# Patient Record
Sex: Male | Born: 1965 | Race: White | Hispanic: No | Smoking: Never smoker
Health system: Southern US, Community
[De-identification: ages and names within clinical notes are randomized; demographics above are authoritative.]

## PROBLEM LIST (undated history)

## (undated) DIAGNOSIS — F419 Anxiety disorder, unspecified: Secondary | ICD-10-CM

---

## 2015-09-18 ENCOUNTER — Emergency Department
Admission: EM | Admit: 2015-09-18 | Discharge: 2015-09-18 | Disposition: A | Discharge status: Home / Self Care | Payer: BLUE CROSS/BLUE SHIELD | Source: Home / Self Care | Attending: Family Medicine | Admitting: Family Medicine

## 2015-09-18 ENCOUNTER — Encounter: Payer: Self-pay | Admitting: *Deleted

## 2015-09-18 DIAGNOSIS — J069 Acute upper respiratory infection, unspecified: Secondary | ICD-10-CM | POA: Diagnosis not present

## 2015-09-18 DIAGNOSIS — R053 Chronic cough: Secondary | ICD-10-CM

## 2015-09-18 DIAGNOSIS — R05 Cough: Secondary | ICD-10-CM | POA: Diagnosis not present

## 2015-09-18 HISTORY — DX: Anxiety disorder, unspecified: F41.9

## 2015-09-18 MED ORDER — DOXYCYCLINE HYCLATE 100 MG PO CAPS: 100.0000 mg | ORAL_CAPSULE | Freq: Two times a day (BID) | ORAL | Status: DC

## 2015-09-18 MED ORDER — BENZONATATE 100 MG PO CAPS: 100.0000 mg | ORAL_CAPSULE | Freq: Three times a day (TID) | ORAL | Status: DC

## 2015-09-18 NOTE — ED Notes (Signed)
Pt c/o 1 month of dry cough, sinus problems, congestion, ear pressure. He started imropving for 2 weeks but has returned worse. Taken Sudafed, Aleve, Day/nyquil otc.

## 2015-09-18 NOTE — Discharge Instructions (Signed)
You may take 400-600mg Ibuprofen (Motrin) every 6-8 hours for fever and pain  °Alternate with Tylenol  °You may take 500mg Tylenol every 4-6 hours as needed for fever and pain  °Follow-up with your primary care provider next week for recheck of symptoms if not improving.  °Be sure to drink plenty of fluids and rest, at least 8hrs of sleep a night, preferably more while you are sick. °Return urgent care or go to closest ER if you cannot keep down fluids/signs of dehydration, fever not reducing with Tylenol, difficulty breathing/wheezing, stiff neck, worsening condition, or other concerns (see below)  °Please take antibiotics as prescribed and be sure to complete entire course even if you start to feel better to ensure infection does not come back. ° ° °Cool Mist Vaporizers °Vaporizers may help relieve the symptoms of a cough and cold. They add moisture to the air, which helps mucus to become thinner and less sticky. This makes it easier to breathe and cough up secretions. Cool mist vaporizers do not cause serious burns like hot mist vaporizers, which may also be called steamers or humidifiers. Vaporizers have not been proven to help with colds. You should not use a vaporizer if you are allergic to mold. °HOME CARE INSTRUCTIONS °· Follow the package instructions for the vaporizer. °· Do not use anything other than distilled water in the vaporizer. °· Do not run the vaporizer all of the time. This can cause mold or bacteria to grow in the vaporizer. °· Clean the vaporizer after each time it is used. °· Clean and dry the vaporizer well before storing it. °· Stop using the vaporizer if worsening respiratory symptoms develop. °  °This information is not intended to replace advice given to you by your health care provider. Make sure you discuss any questions you have with your health care provider. °  °Document Released: 05/05/2004 Document Revised: 08/13/2013 Document Reviewed: 12/26/2012 °Elsevier Interactive Patient  Education ©2016 [employer] ° °

## 2015-09-18 NOTE — ED Provider Notes (Signed)
CSN: [id number]     Arrival date & time 09/18/15  1556 History   First MD Initiated Contact with Patient 09/18/15 1628     Chief Complaint  Patient presents with  . Sinus Problem  . Cough   (Consider location/radiation/quality/duration/timing/severity/associated sxs/prior Treatment) HPI Pt is a 50yo male presenting to [facility] with c/o dry cough for 1 month, nasal congestion and ear pressure.  Pt states he started to improve for 2 weeks but then symptoms started to worsen again.  He has been taking Sudafed, mucinex, dayquil/nyquil and Aleve w/o relief.  Cough is becoming productive mild to moderate in severity, body aches and sore throat.  Denies fever, chills, n/v/d. His wife has been sick as well.  Denies recent travel. Denies chest pain or SOB. Denies hx of asthma.   Past Medical History  Diagnosis Date  . Anxiety    History reviewed. No pertinent past surgical history. History reviewed. No pertinent family history. Social History  Substance Use Topics  . Smoking status: Never Smoker   . Smokeless tobacco: Never Used  . Alcohol Use: No    Review of Systems  Constitutional: Negative for fever and chills.  HENT: Positive for congestion, ear pain, postnasal drip, rhinorrhea, sinus pressure, sore throat and voice change. Negative for trouble swallowing.   Respiratory: Positive for cough and wheezing. Negative for shortness of breath.   Cardiovascular: Negative for chest pain and palpitations.  Gastrointestinal: Negative for nausea, vomiting, abdominal pain and diarrhea.  Musculoskeletal: Negative for myalgias, back pain and arthralgias.  Skin: Negative for rash.  Neurological: Positive for headaches. Negative for dizziness and light-headedness.  All other systems reviewed and are negative.   Allergies  Review of patient's allergies indicates no known allergies.  Home Medications   Prior to Admission medications   Medication Sig Start Date End Date Taking? Authorizing Provider   citalopram (CELEXA) 20 MG tablet Take 20 mg by mouth daily.   Yes Historical Provider, MD  benzonatate (TESSALON) 100 MG capsule Take 1 capsule (100 mg total) by mouth every 8 (eight) hours. 09/18/15   Junius Finner, PA-C  doxycycline (VIBRAMYCIN) 100 MG capsule Take 1 capsule (100 mg total) by mouth 2 (two) times daily. One po bid x 7 days 09/18/15   Junius Finner, PA-C   Meds Ordered and Administered this Visit  Medications - No data to display  BP 146/99 mmHg  Pulse 70  Temp(Src) 98.4 F (36.9 C) (Oral)  Resp 16  Ht  (1.778 m)  Wt 210 lb (95.255 kg)  BMI 30.13 kg/m2  SpO2 99% No data found.   Physical Exam  Constitutional: He appears well-developed and well-nourished.  HENT:  Head: Normocephalic and atraumatic.  Right Ear: Hearing, tympanic membrane and external ear normal. A foreign body ( PE tube about to come out. no bleeding.) is present.  Left Ear: Hearing, tympanic membrane, external ear and ear canal normal.  Nose: Mucosal edema present. Right sinus exhibits no maxillary sinus tenderness and no frontal sinus tenderness. Left sinus exhibits no maxillary sinus tenderness and no frontal sinus tenderness.  Mouth/Throat: Uvula is midline and mucous membranes are normal. Posterior oropharyngeal erythema present. No oropharyngeal exudate, posterior oropharyngeal edema or tonsillar abscesses.  Eyes: Conjunctivae are normal. No scleral icterus.  Neck: Normal range of motion. Neck supple.  Cardiovascular: Normal rate, regular rhythm and normal heart sounds.   Pulmonary/Chest: Effort normal and breath sounds normal. No respiratory distress. He has no wheezes. He has no rales. He exhibits no  tenderness.  Abdominal: Soft. He exhibits no distension and no mass. There is no tenderness. There is no rebound and no guarding.  Musculoskeletal: Normal range of motion.  Neurological: He is alert.  Skin: Skin is warm and dry.  Nursing note and vitals reviewed.   ED Course  Procedures  (including critical care time)  Labs Review Labs Reviewed - No data to display  Imaging Review No results found.    MDM   1. Acute upper respiratory infection   2. Persistent cough for 3 weeks or longer    Pt c/o URI symptoms for about 1 month that are now worsening. Pt appears well, O2 Sat 99% on RA.  Due to duration of symptoms, will place on antibiotic. Rx: doxycycline and tessalon   Advised pt to use acetaminophen and ibuprofen as needed for fever and pain. Encouraged rest and fluids. F/u with PCP in 7-10 days if not improving, sooner if worsening. Pt verbalized understanding and agreement with tx plan.     Junius Finner, PA-C 09/18/15 1724

## 2016-04-07 ENCOUNTER — Encounter (HOSPITAL_BASED_OUTPATIENT_CLINIC_OR_DEPARTMENT_OTHER): Payer: Self-pay | Admitting: *Deleted

## 2016-04-07 ENCOUNTER — Emergency Department (HOSPITAL_BASED_OUTPATIENT_CLINIC_OR_DEPARTMENT_OTHER)
Admission: EM | Admit: 2016-04-07 | Discharge: 2016-04-07 | Disposition: A | Discharge status: Home / Self Care | Payer: BLUE CROSS/BLUE SHIELD | Attending: Emergency Medicine | Admitting: Emergency Medicine

## 2016-04-07 ENCOUNTER — Emergency Department (HOSPITAL_BASED_OUTPATIENT_CLINIC_OR_DEPARTMENT_OTHER): Payer: BLUE CROSS/BLUE SHIELD

## 2016-04-07 DIAGNOSIS — I861 Scrotal varices: Secondary | ICD-10-CM | POA: Insufficient documentation

## 2016-04-07 DIAGNOSIS — R1032 Left lower quadrant pain: Secondary | ICD-10-CM | POA: Insufficient documentation

## 2016-04-07 DIAGNOSIS — R1030 Lower abdominal pain, unspecified: Secondary | ICD-10-CM

## 2016-04-07 LAB — URINALYSIS, ROUTINE W REFLEX MICROSCOPIC
BILIRUBIN URINE: NEGATIVE
GLUCOSE, UA: NEGATIVE mg/dL
HGB URINE DIPSTICK: NEGATIVE
KETONES UR: NEGATIVE mg/dL
LEUKOCYTES UA: NEGATIVE
Nitrite: NEGATIVE
PROTEIN: NEGATIVE mg/dL
Specific Gravity, Urine: 1.003 — ABNORMAL LOW (ref 1.005–1.030)
pH: 6.5 (ref 5.0–8.0)

## 2016-04-07 NOTE — ED Triage Notes (Signed)
Intermittent left groin pain with radiation into testicle x 1 month.  Denies swelling.  Dull pain with increased pain on palpation.

## 2016-04-07 NOTE — ED Provider Notes (Signed)
[facility]   CSN: [id number] Arrival date & time: 04/07/16  1218     History   Chief Complaint Chief Complaint  Patient presents with  . Groin Pain    HPI Gregory Rowland is a 50 y.o. male.  50 year old male with history of anxiety who presents with left groin pain. The patient reports a one-month history of intermittent left groin pain radiating into his left testicle. He states the pain is minimal to absent when he is laying down but worsens when he is standing up and walking around at work. He has not noticed any masses or swelling in his groin or scrotum. No urinary symptoms, fever, vomiting, abdominal pain, or recent illness. No skin changes. No recent trauma.   The history is provided by the patient.  Groin Pain     Past Medical History:  Diagnosis Date  . Anxiety     There are no active problems to display for this patient.   History reviewed. No pertinent surgical history.     Home Medications    Prior to Admission medications   Medication Sig Start Date End Date Taking? Authorizing Provider  citalopram (CELEXA) 20 MG tablet Take 20 mg by mouth daily.    Historical Provider, MD    Family History History reviewed. No pertinent family history.  Social History Social History  Substance Use Topics  . Smoking status: Never Smoker  . Smokeless tobacco: Never Used  . Alcohol use No     Allergies   Review of patient's allergies indicates no known allergies.   Review of Systems Review of Systems 10 Systems reviewed and are negative for acute change except as noted in the HPI.   Physical Exam Updated Vital Signs BP 151/95 (BP Location: Right Arm)   Pulse 64   Temp 98 F (36.7 C) (Oral)   Resp 18   Ht 5\' 10"  (1.778 m)   Wt 205 lb (93 kg)   SpO2 99%   BMI 29.41 kg/m   Physical Exam  Constitutional: He is oriented to person, place, and time. He appears well-developed and well-nourished. No distress.  HENT:  Head:  Normocephalic and atraumatic.  Eyes: Conjunctivae are normal.  Neck: Neck supple.  Cardiovascular: Normal rate, regular rhythm and normal heart sounds.   No murmur heard. Pulmonary/Chest: Effort normal and breath sounds normal.  Abdominal: Soft. Bowel sounds are normal. He exhibits no distension. There is no tenderness.  Genitourinary: Penis normal.  Genitourinary Comments: B/l descended testes w/ no focal tenderness, swelling, scrotal enlargement, or erythema  Musculoskeletal: He exhibits no edema.  Neurological: He is alert and oriented to person, place, and time.  Fluent speech  Skin: Skin is warm and dry. No rash noted. No erythema.  Psychiatric: He has a normal mood and affect. Judgment normal.  Nursing note and vitals reviewed. Chaperone was present during exam.    ED Treatments / Results  Labs (all labs ordered are listed, but only abnormal results are displayed) Labs Reviewed  URINALYSIS, ROUTINE W REFLEX MICROSCOPIC (NOT AT [facility]) - Abnormal; Notable for the following:       Result Value   Specific Gravity, Urine 1.003 (*)    All other components within normal limits    EKG  EKG Interpretation None       Radiology [place] Scrotum  Result Date: 04/07/2016 CLINICAL DATA:  Left groin pain that radiates to the testicle. Symptoms for 1 month. EXAM: SCROTAL ULTRASOUND DOPPLER ULTRASOUND OF THE TESTICLES TECHNIQUE:  Complete ultrasound examination of the testicles, epididymis, and other scrotal structures was performed. Color and spectral Doppler ultrasound were also utilized to evaluate blood flow to the testicles. COMPARISON:  None. FINDINGS: Right testicle Measurements: 4.6 x 2.3 x 3.1 cm. No mass or microlithiasis visualized. Left testicle Measurements: 3.3 x 2.2 x 2.3 cm. No microlithiasis. There are 2 adjacent hypoechoic structures in the testicle, largest measuring 0.3 cm. This could represent cystic structures but indeterminate. Right epididymis: Multiple cysts within the  epididymal head, largest measured up to 1.4 cm. Left epididymis:  Normal in size and appearance. Hydrocele:  None visualized. Varicocele:  Left varicocele. Pulsed Doppler interrogation of both testes demonstrates normal low resistance arterial and venous waveforms bilaterally. IMPRESSION: Negative for testicular torsion. Left varicocele. Small hypoechoic structures in the left testicle could represent small cystic structures but indeterminate. Recommend a 3 month follow-up ultrasound to ensure stability. Electronically Signed   By: Richarda OverlieAdam  Henn M.D.   On: 04/07/2016 13:53   [place] Art/ven Flow Abd Pelv Doppler  Result Date: 04/07/2016 CLINICAL DATA:  Left groin pain that radiates to the testicle. Symptoms for 1 month. EXAM: SCROTAL ULTRASOUND DOPPLER ULTRASOUND OF THE TESTICLES TECHNIQUE: Complete ultrasound examination of the testicles, epididymis, and other scrotal structures was performed. Color and spectral Doppler ultrasound were also utilized to evaluate blood flow to the testicles. COMPARISON:  None. FINDINGS: Right testicle Measurements: 4.6 x 2.3 x 3.1 cm. No mass or microlithiasis visualized. Left testicle Measurements: 3.3 x 2.2 x 2.3 cm. No microlithiasis. There are 2 adjacent hypoechoic structures in the testicle, largest measuring 0.3 cm. This could represent cystic structures but indeterminate. Right epididymis: Multiple cysts within the epididymal head, largest measured up to 1.4 cm. Left epididymis:  Normal in size and appearance. Hydrocele:  None visualized. Varicocele:  Left varicocele. Pulsed Doppler interrogation of both testes demonstrates normal low resistance arterial and venous waveforms bilaterally. IMPRESSION: Negative for testicular torsion. Left varicocele. Small hypoechoic structures in the left testicle could represent small cystic structures but indeterminate. Recommend a 3 month follow-up ultrasound to ensure stability. Electronically Signed   By: Richarda OverlieAdam  Henn M.D.   On: 04/07/2016 13:53     Procedures Procedures (including critical care time)  Medications Ordered in ED Medications - No data to display   Initial Impression / Assessment and Plan / ED Course  I have reviewed the triage vital signs and the nursing notes.  Pertinent labs & imaging results that were available during my care of the patient were reviewed by me and considered in my medical decision making (see chart for details).  Clinical Course   Pt w/ 1 month of Intermittent left groin pain. He was well-appearing on exam, UA normal. No obvious testicular swelling or abnormalities on physical exam. Ultrasound shows left varicocele, possible cystic structures on left testicle. I discussed results and importance of follow-up with urologist for further evaluation and provided him with clinic information. Return precautions extensively reviewed. Patient voiced understanding was discharged in satisfactory condition.  Final Clinical Impressions(s) / ED Diagnoses   Final diagnoses:  Groin pain  Left varicocele    New Prescriptions New Prescriptions   No medications on file     Laurence Spatesachel Morgan Lyndal Alamillo, MD 04/07/16 1537

## 2018-04-02 IMAGING — US US SCROTUM
1 series · 13 of 25 positions shown · non-contrast
Comparison: None.

CLINICAL DATA: Left groin pain that radiates to the testicle.
Symptoms for 1 month.

EXAM:
SCROTAL ULTRASOUND
DOPPLER ULTRASOUND OF THE TESTICLES
TECHNIQUE: Complete ultrasound examination of the testicles, epididymis, and
other scrotal structures was performed. Color and spectral Doppler
ultrasound were also utilized to evaluate blood flow to the
testicles.

[Series 1: us scrotum · 0.07mm/px · 13 of 62 slices shown]
[im 1/62]
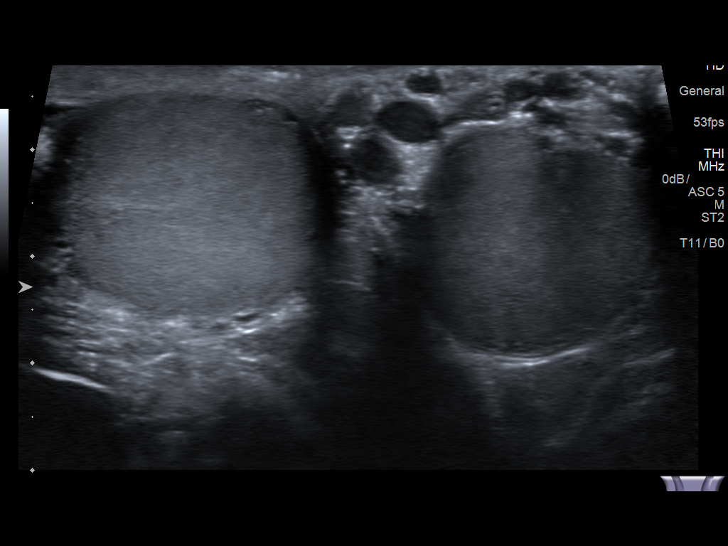
[im 6/62]
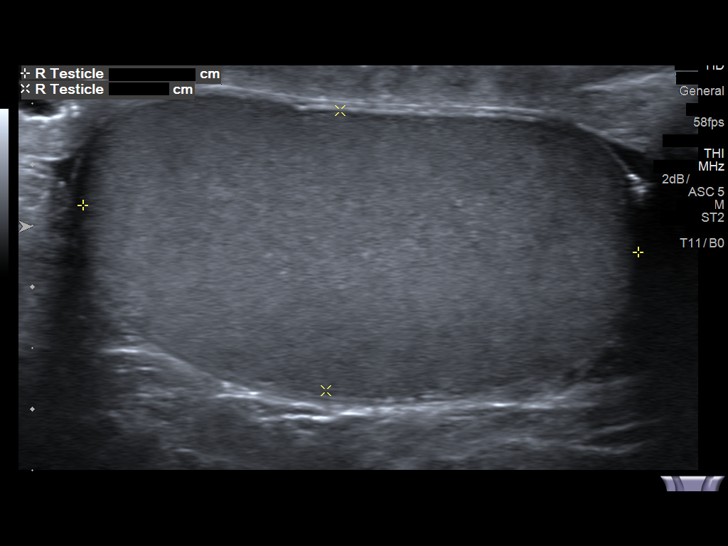
[im 11/62]
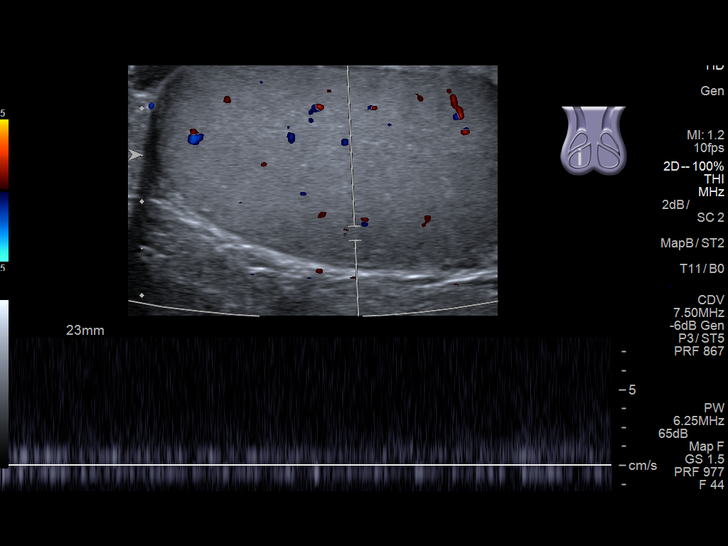
[im 16/62]
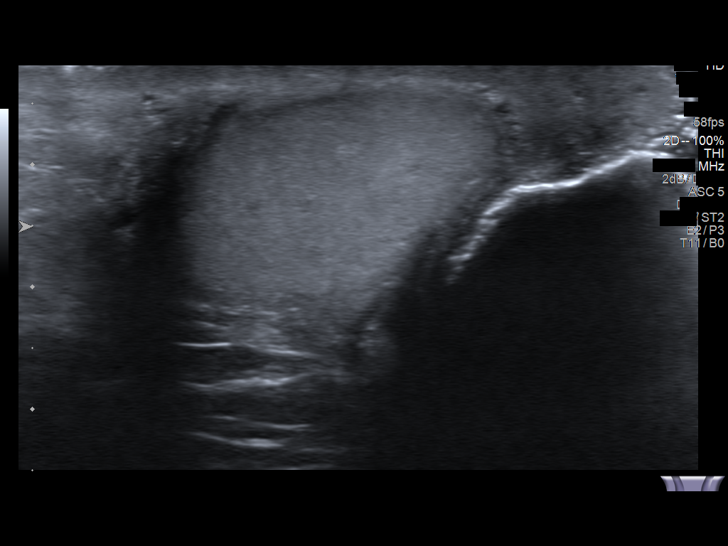
[im 21/62]
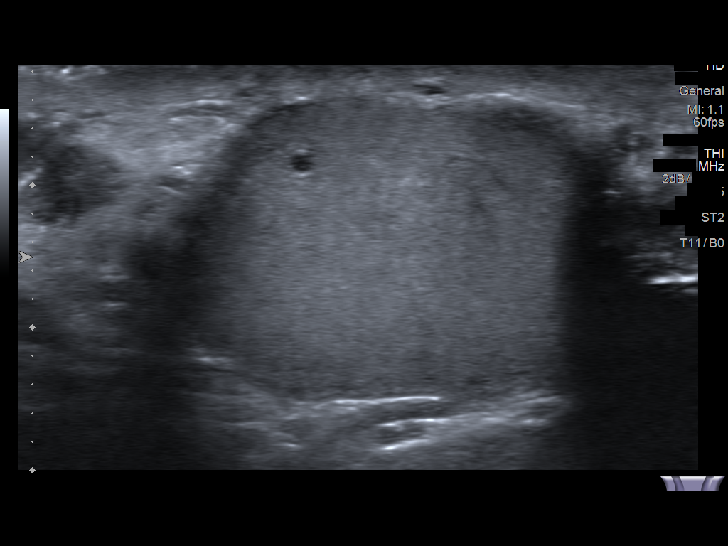
[im 26/62]
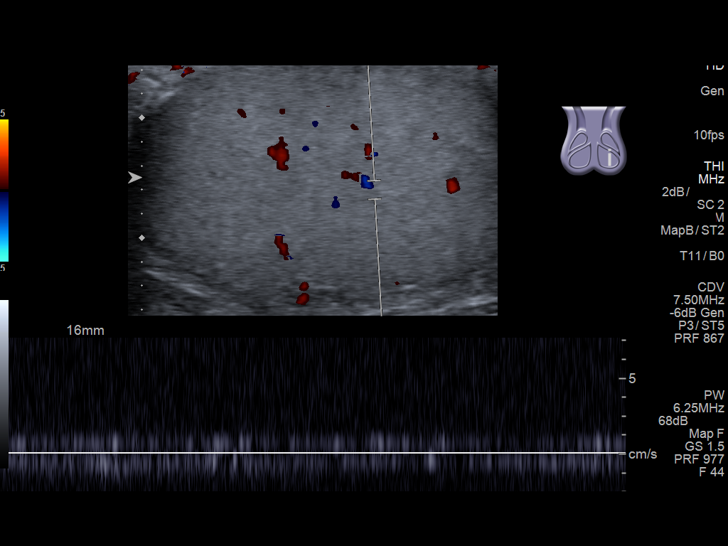
[im 31/62]
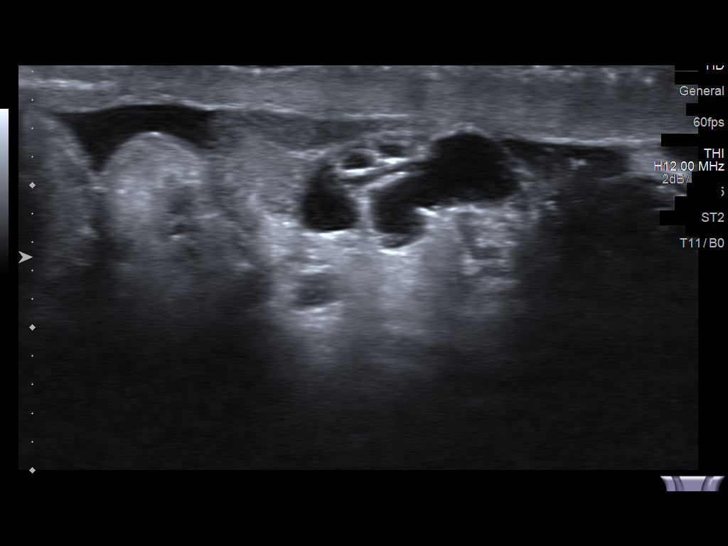
[im 36/62]
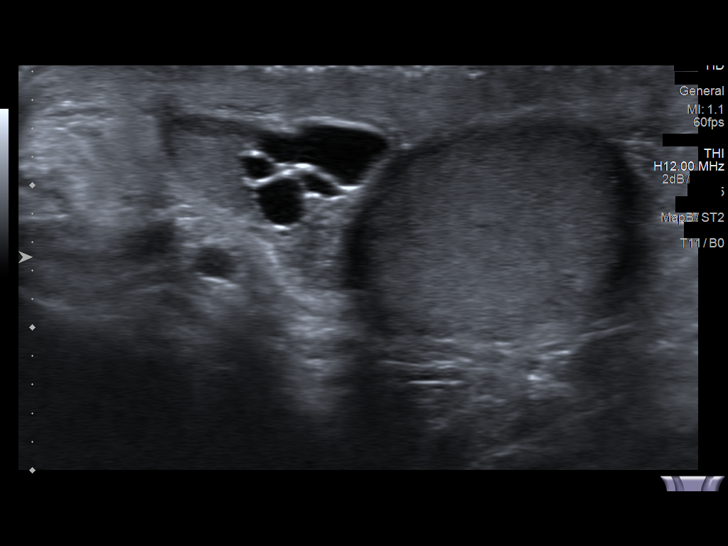
[im 41/62]
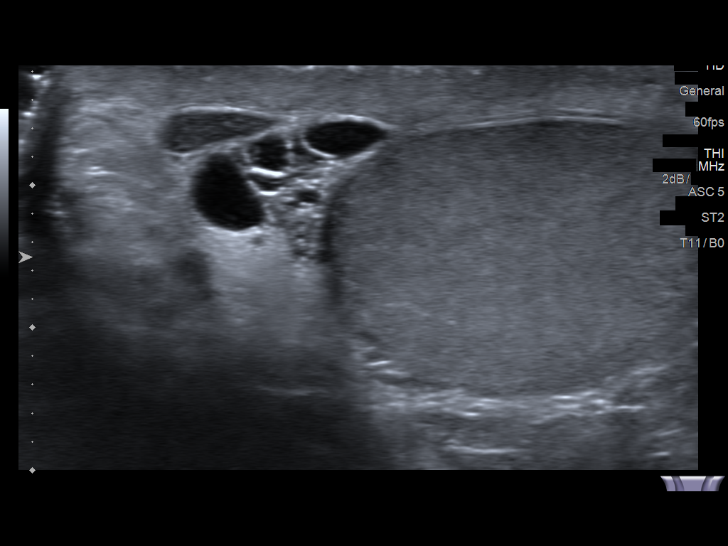
[im 46/62]
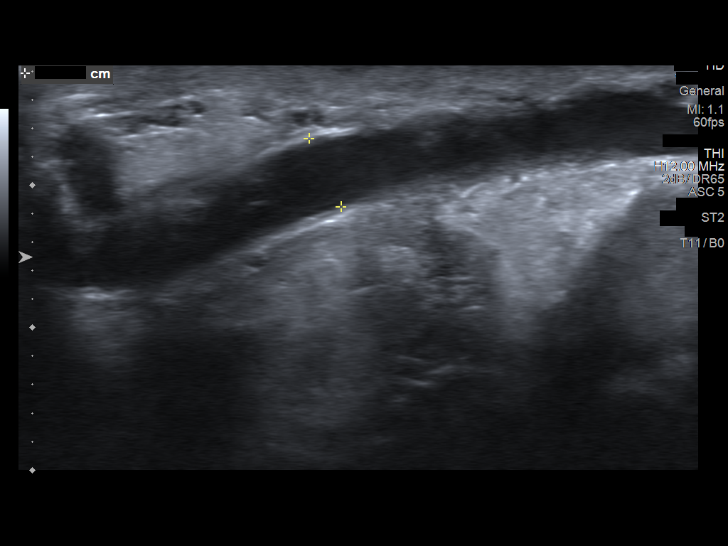
[im 51/62]
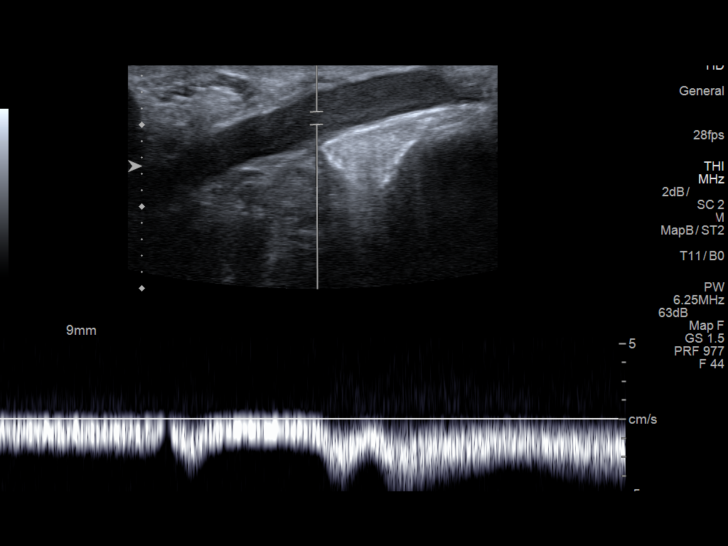
[im 56/62]
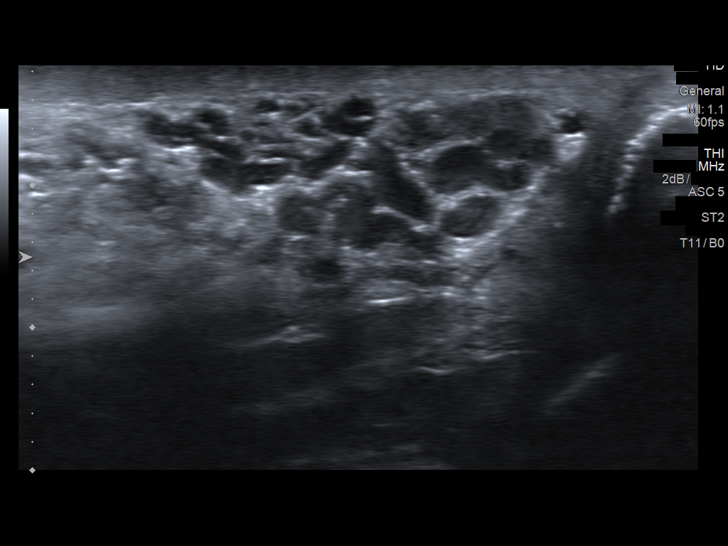
[im 62/62]
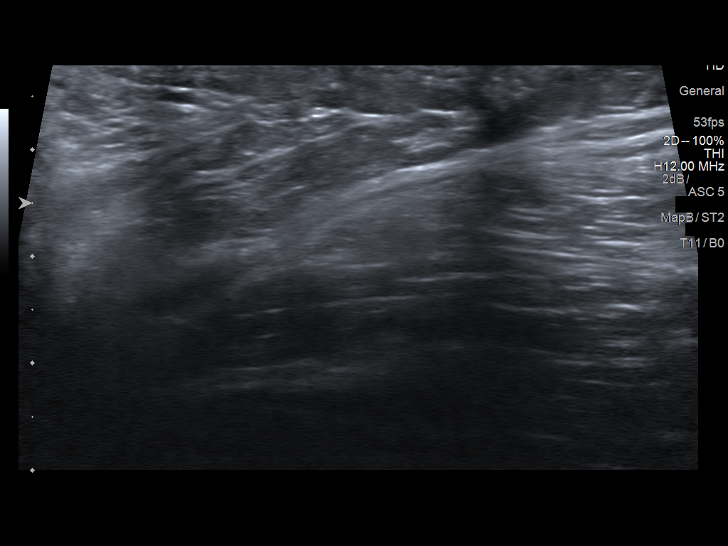

[13 of 25 positions shown; findings below may reference images not displayed]

FINDINGS: Right testicle

Measurements: 4.6 x 2.3 x 3.1 cm. No mass or microlithiasis
visualized.

Left testicle

Measurements: 3.3 x 2.2 x 2.3 cm. No microlithiasis. There are 2
adjacent hypoechoic structures in the testicle, largest measuring
0.3 cm. This could represent cystic structures but indeterminate.

Right epididymis: Multiple cysts within the epididymal head, largest
measured up to 1.4 cm.

Left epididymis:  Normal in size and appearance.

Hydrocele:  None visualized.

Varicocele:  Left varicocele.

Pulsed Doppler interrogation of both testes demonstrates normal low
resistance arterial and venous waveforms bilaterally.
IMPRESSION: Negative for testicular torsion.

Left varicocele.

Small hypoechoic structures in the left testicle could represent
small cystic structures but indeterminate. Recommend a 3 month
follow-up ultrasound to ensure stability.

## 2021-06-07 ENCOUNTER — Emergency Department (INDEPENDENT_AMBULATORY_CARE_PROVIDER_SITE_OTHER)
Admission: EM | Admit: 2021-06-07 | Discharge: 2021-06-07 | Disposition: A | Discharge status: Home / Self Care | Payer: BC Managed Care – PPO | Source: Home / Self Care

## 2021-06-07 ENCOUNTER — Other Ambulatory Visit: Payer: Self-pay

## 2021-06-07 DIAGNOSIS — J0100 Acute maxillary sinusitis, unspecified: Secondary | ICD-10-CM

## 2021-06-07 DIAGNOSIS — J309 Allergic rhinitis, unspecified: Secondary | ICD-10-CM | POA: Diagnosis not present

## 2021-06-07 DIAGNOSIS — R509 Fever, unspecified: Secondary | ICD-10-CM

## 2021-06-07 MED ORDER — AMOXICILLIN-POT CLAVULANATE 875-125 MG PO TABS: 1.0000 | ORAL_TABLET | Freq: Two times a day (BID) | ORAL | 0 refills | Status: AC

## 2021-06-07 MED ORDER — PREDNISONE 20 MG PO TABS: ORAL_TABLET | ORAL | 0 refills | Status: AC

## 2021-06-07 MED ORDER — FEXOFENADINE HCL 180 MG PO TABS: 180.0000 mg | ORAL_TABLET | Freq: Every day | ORAL | 0 refills | Status: AC

## 2021-06-07 NOTE — Discharge Instructions (Addendum)
Advised patient to take medication as directed with food to completion.  Advised patient to take Prednisone burst with first dose of antibiotic for 5 of 10 days.  Advised patient to take Allegra with first dose of antibiotic for 7 of 10 days.  Encouraged patient increase daily water intake while taking these medications.  Advised we will follow-up with COVID-19 flu/AB results once returned.

## 2021-06-07 NOTE — ED Triage Notes (Signed)
Pt c/o headache (dull)  x 2 months. Also c/o neck swelling on RT side. Pain sometime shoots down throat causing him to cough. Problem has been intermittent since having COVID last Dec. Chronically fatigued. Ibuprofen prn.

## 2021-06-07 NOTE — ED Provider Notes (Signed)
[facility]    CSN: [id number] Arrival date & time: 06/07/21  1233      History   Chief Complaint Chief Complaint  Patient presents with   Headache   Neck swelling (RT)    HPI Gregory Rowland is a 55 y.o. male.   HPI 55 year old male presents with dull headache for 2 months.  Patient reports neck swelling on right side for days.  Reports throat pain will sometimes shoot down neck causing cough.  Reports these problems have been intermittent since COVID last December.  Additionally, patient is complaining of chronic fatigue.  Past Medical History:  Diagnosis Date   Anxiety     There are no problems to display for this patient.   History reviewed. No pertinent surgical history.     Home Medications    Prior to Admission medications   Medication Sig Start Date End Date Taking? Authorizing Provider  amoxicillin-clavulanate (AUGMENTIN) 875-125 MG tablet Take 1 tablet by mouth 2 (two) times daily for 10 days. 06/07/21 06/17/21 Yes Trevor Iha, FNP  amoxicillin-clavulanate (AUGMENTIN) 875-125 MG tablet Take 1 tablet by mouth every 12 (twelve) hours. 06/07/21  Yes Trevor Iha, FNP  citalopram (CELEXA) 20 MG tablet Take 20 mg by mouth daily.   Yes [provider]  fexofenadine (ALLEGRA ALLERGY) 180 MG tablet Take 1 tablet (180 mg total) by mouth daily for 15 days. 06/07/21 06/22/21 Yes Trevor Iha, FNP  fexofenadine [facility] ALLERGY) 180 MG tablet Take 1 tablet (180 mg total) by mouth daily for 15 days. 06/07/21 06/22/21 Yes Trevor Iha, FNP  predniSONE (DELTASONE) 20 MG tablet Take 3 tabs p.o. x 5 days. 06/07/21  Yes Trevor Iha, FNP  predniSONE (DELTASONE) 20 MG tablet Take 3 tabs p.o. x 5 days. 06/07/21  Yes Trevor Iha, FNP    Family History History reviewed. No pertinent family history.  Social History Social History   Tobacco Use   Smoking status: Never   Smokeless tobacco: Never  Vaping Use   Vaping Use: Never used  Substance  Use Topics   Alcohol use: No   Drug use: No     Allergies   Patient has no known allergies.   Review of Systems Review of Systems  Respiratory:  Positive for cough.   Neurological:  Positive for headaches.  All other systems reviewed and are negative.   Physical Exam Triage Vital Signs ED Triage Vitals  Enc Vitals Group     BP 06/07/21 1326 (!) 169/95     Pulse Rate 06/07/21 1326 93     Resp 06/07/21 1326 18     Temp 06/07/21 1326 99.4 F (37.4 C)     Temp Source 06/07/21 1326 Oral     SpO2 06/07/21 1326 99 %     Weight --      Height --      Head Circumference --      Peak Flow --      Pain Score 06/07/21 1328 3     Pain Loc --      Pain Edu? --      Excl. in GC? --    No data found.  Updated Vital Signs BP (!) 169/95 (BP Location: Right Arm)   Pulse 93   Temp 99.4 F (37.4 C) (Oral)   Resp 18   SpO2 99%    Physical Exam Vitals and nursing note reviewed.  Constitutional:      Appearance: He is obese.  HENT:     Head:  Normocephalic and atraumatic.     Right Ear: Tympanic membrane, ear canal and external ear normal.     Left Ear: Tympanic membrane, ear canal and external ear normal.     Mouth/Throat:     Mouth: Mucous membranes are moist.     Pharynx: Oropharynx is clear.     Comments: Moderate amount of clear drainage of posterior oropharynx noted Eyes:     Extraocular Movements: Extraocular movements intact.     Conjunctiva/sclera: Conjunctivae normal.     Pupils: Pupils are equal, round, and reactive to light.  Cardiovascular:     Rate and Rhythm: Normal rate and regular rhythm.     Pulses: Normal pulses.     Heart sounds: Normal heart sounds. No murmur heard. Pulmonary:     Effort: Pulmonary effort is normal.     Breath sounds: Normal breath sounds. No wheezing, rhonchi or rales.  Musculoskeletal:        General: Normal range of motion.     Cervical back: Normal range of motion and neck supple. Tenderness present.  Lymphadenopathy:      Cervical: Cervical adenopathy present.  Skin:    General: Skin is warm and dry.  Neurological:     General: No focal deficit present.     Mental Status: He is alert and oriented to person, place, and time. Mental status is at baseline.     UC Treatments / Results  Labs (all labs ordered are listed, but only abnormal results are displayed) Labs Reviewed  COVID-19, FLU A+B NAA    EKG   Radiology No results found.  Procedures Procedures (including critical care time)  Medications Ordered in UC Medications - No data to display  Initial Impression / Assessment and Plan / UC Course  I have reviewed the triage vital signs and the nursing notes.  Pertinent labs & imaging results that were available during my care of the patient were reviewed by me and considered in my medical decision making (see chart for details).     MDM: 1.  Fever-COVID-19 flu/AB ordered.  Advised patient we will follow-up once results are received. 2.  Acute maxillary sinusitis-Rx'd Augmentin and prednisone burst; 3.  Allergic rhinitis Rx'd Allegra. Advised patient to take medication as directed with food to completion.  Advised patient to take Prednisone burst with first dose of antibiotic for 5 of 10 days.  Advised patient to take Allegra with first dose of antibiotic for 7 of 10 days.  Encouraged patient increase daily water intake while taking these medications.  Advised we will follow-up with COVID-19 flu/AB results once returned.  Patient discharged home, hemodynamically stable. Final Clinical Impressions(s) / UC Diagnoses   Final diagnoses:  Fever, unspecified  Acute maxillary sinusitis, recurrence not specified  Allergic rhinitis, unspecified seasonality, unspecified trigger     Discharge Instructions      Advised patient to take medication as directed with food to completion.  Advised patient to take Prednisone burst with first dose of antibiotic for 5 of 10 days.  Advised patient to take  Allegra with first dose of antibiotic for 7 of 10 days.  Encouraged patient increase daily water intake while taking these medications.  Advised we will follow-up with COVID-19 flu/AB results once returned.     ED Prescriptions     Medication Sig Dispense Auth. Provider   amoxicillin-clavulanate (AUGMENTIN) 875-125 MG tablet Take 1 tablet by mouth 2 (two) times daily for 10 days. 20 tablet Trevor Iha, FNP   predniSONE (DELTASONE)  20 MG tablet Take 3 tabs p.o. x 5 days. 15 tablet Trevor Iha, FNP   fexofenadine [facility] ALLERGY) 180 MG tablet Take 1 tablet (180 mg total) by mouth daily for 15 days. 15 tablet Trevor Iha, FNP   amoxicillin-clavulanate (AUGMENTIN) 875-125 MG tablet Take 1 tablet by mouth every 12 (twelve) hours. 20 tablet Trevor Iha, FNP   fexofenadine [facility] ALLERGY) 180 MG tablet Take 1 tablet (180 mg total) by mouth daily for 15 days. 15 tablet Trevor Iha, FNP   predniSONE (DELTASONE) 20 MG tablet Take 3 tabs p.o. x 5 days. 15 tablet Trevor Iha, FNP      PDMP not reviewed this encounter.   Trevor Iha, FNP 06/07/21 1527

## 2021-06-09 LAB — COVID-19, FLU A+B NAA
Influenza A, NAA: NOT DETECTED
Influenza B, NAA: NOT DETECTED
SARS-CoV-2, NAA: NOT DETECTED
